# Patient Record
Sex: Male | Born: 2000 | Race: White | Hispanic: No | Marital: Single | Smoking: Never smoker
Health system: Southern US, Community
[De-identification: ages and names within clinical notes are randomized; demographics above are authoritative.]

---
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
## 2021-05-23 ENCOUNTER — Other Ambulatory Visit: Payer: Self-pay | Admitting: Pediatrics

## 2021-05-23 DIAGNOSIS — K759 Inflammatory liver disease, unspecified: Secondary | ICD-10-CM

## 2021-05-30 ENCOUNTER — Ambulatory Visit: Payer: BC Managed Care – PPO

## 2021-06-05 ENCOUNTER — Other Ambulatory Visit: Payer: Self-pay

## 2021-06-05 ENCOUNTER — Ambulatory Visit
Admission: RE | Admit: 2021-06-05 | Discharge: 2021-06-05 | Disposition: A | Discharge status: Home / Self Care | Payer: BC Managed Care – PPO | Source: Ambulatory Visit | Attending: Pediatrics | Admitting: Pediatrics

## 2021-06-05 DIAGNOSIS — K759 Inflammatory liver disease, unspecified: Secondary | ICD-10-CM

## 2023-01-04 IMAGING — US US ABDOMEN LIMITED
1 series · 14 of 25 positions shown · non-contrast
Comparison: None.

CLINICAL DATA: Elevated LFTs.

EXAM:
ULTRASOUND ABDOMEN LIMITED RIGHT UPPER QUADRANT

[Series 1: us abdomen limited · 0.20mm/px · 14 of 26 slices shown]
[im 1/26]
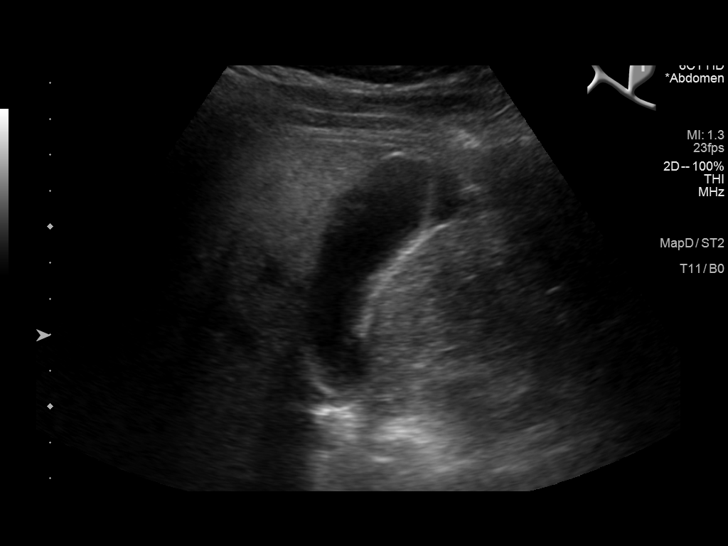
[im 3/26]
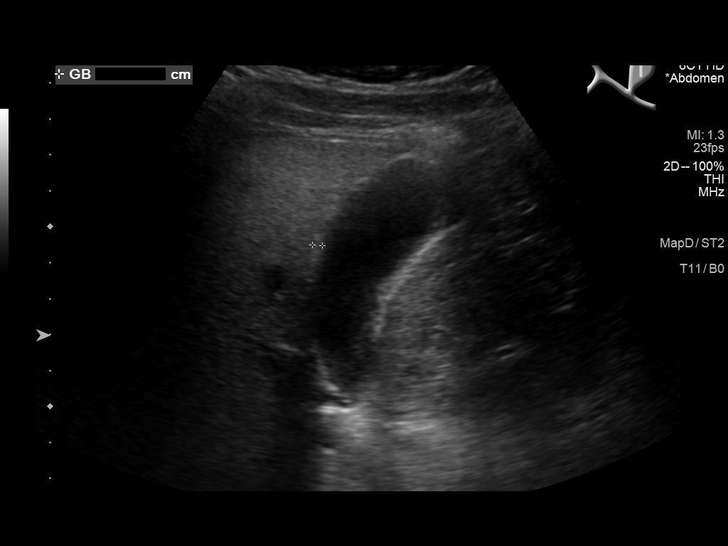
[im 5/26]
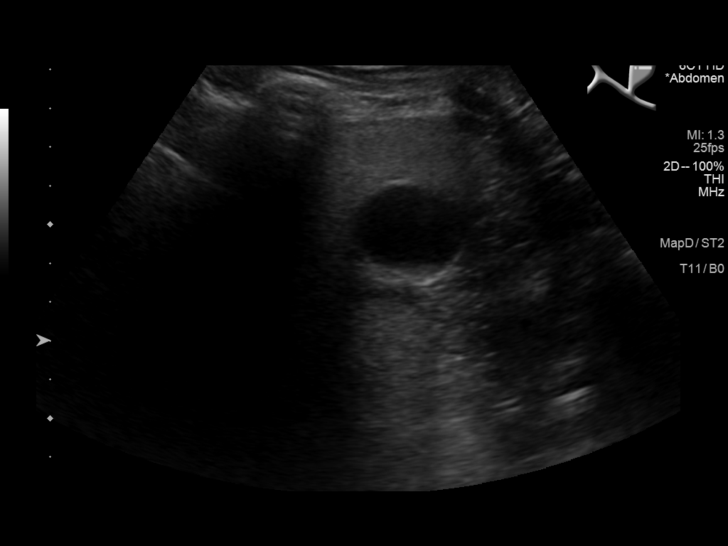
[im 7/26]
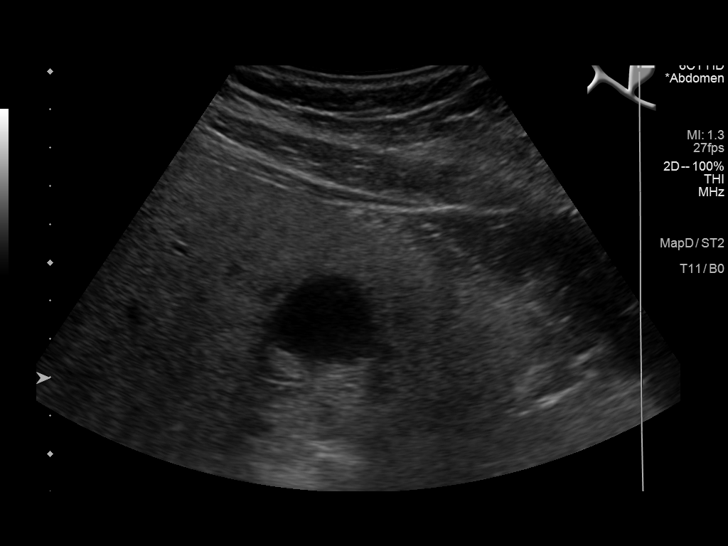
[im 9/26]
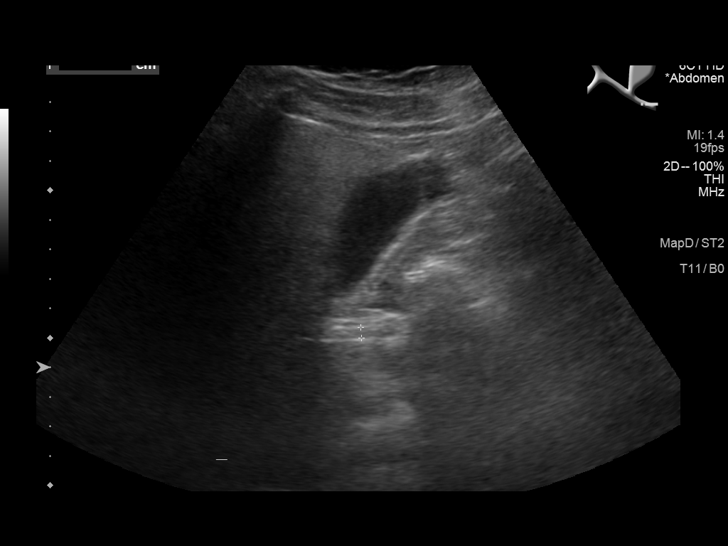
[im 10/26]
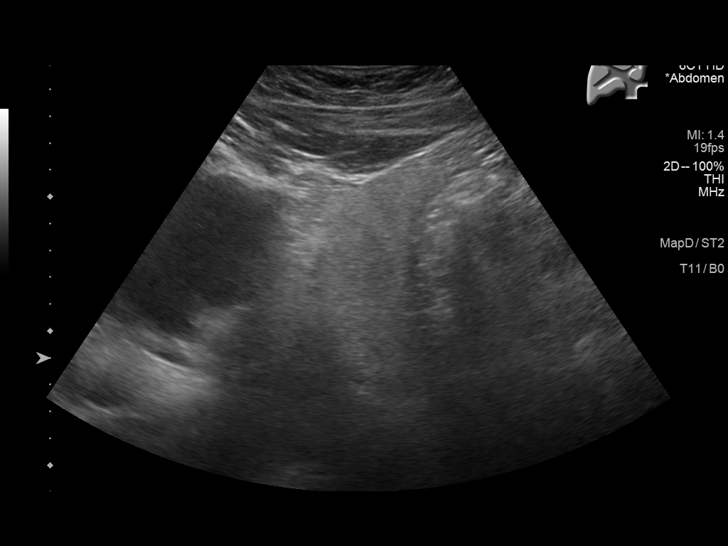
[im 12/26]
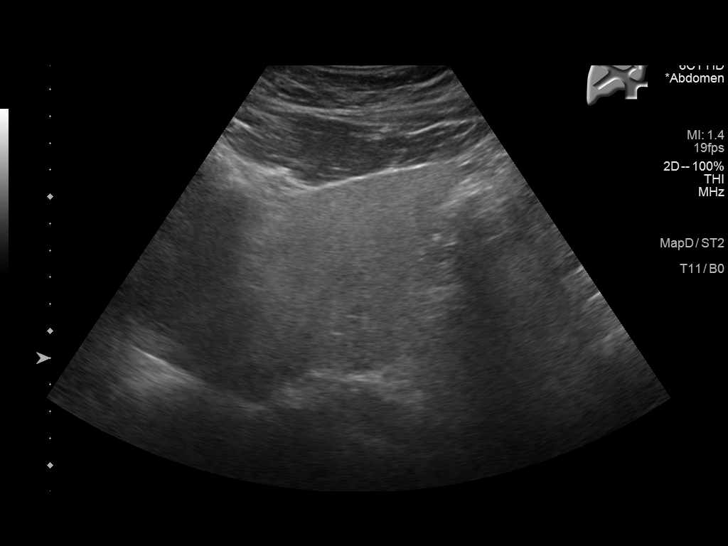
[im 14/26]
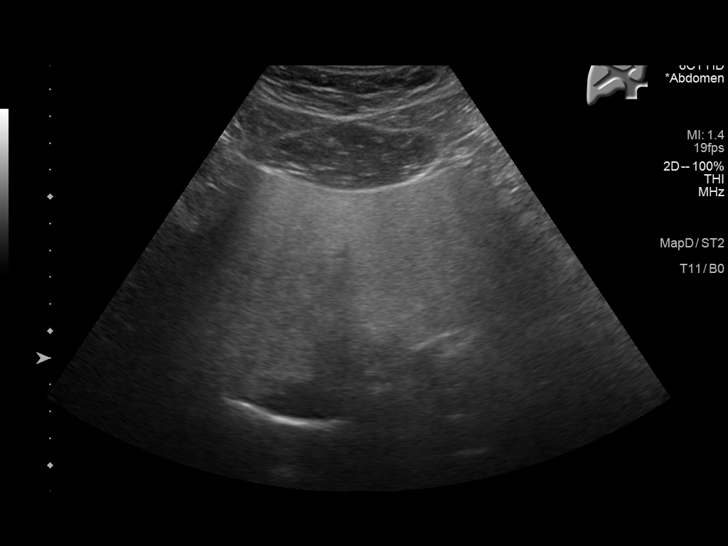
[im 16/26]
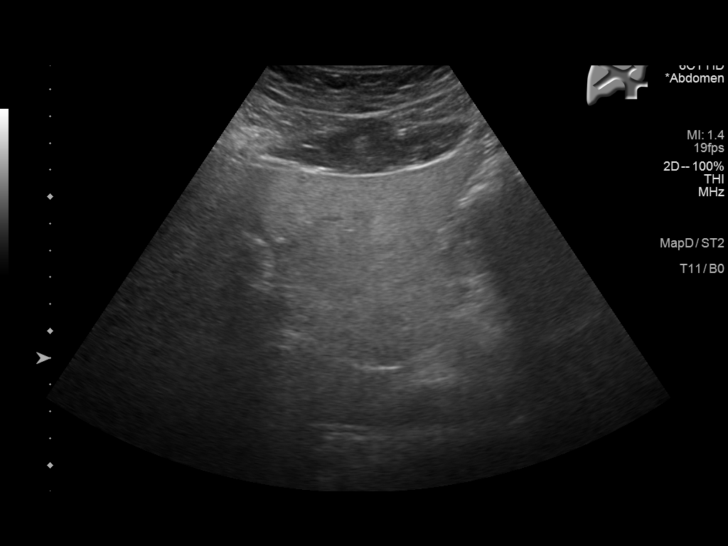
[im 17/26]
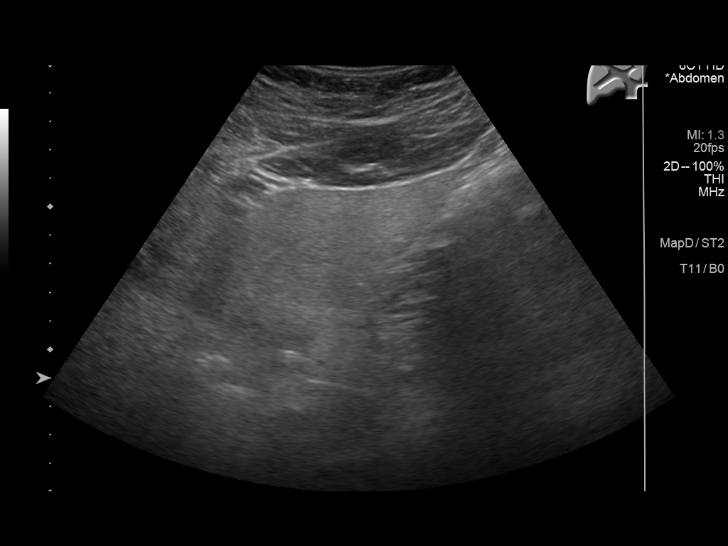
[im 19/26]
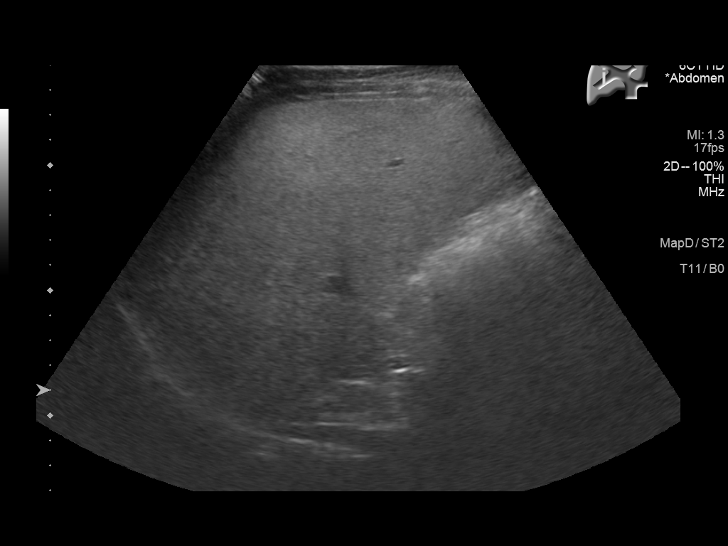
[im 21/26]
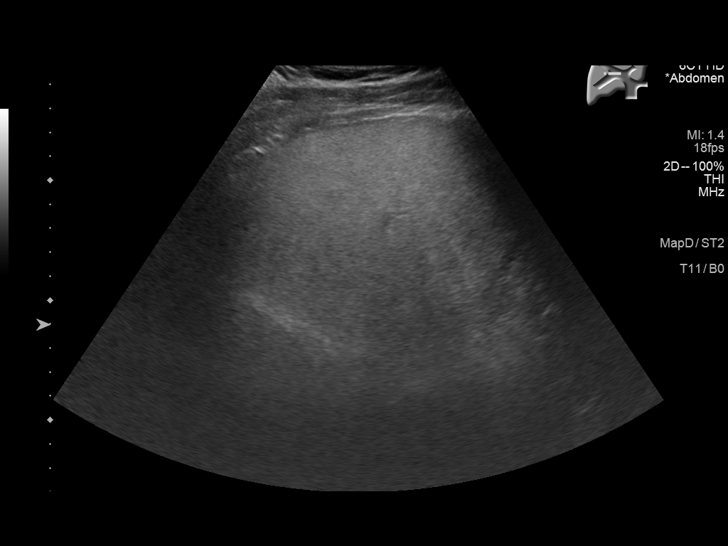
[im 23/26]
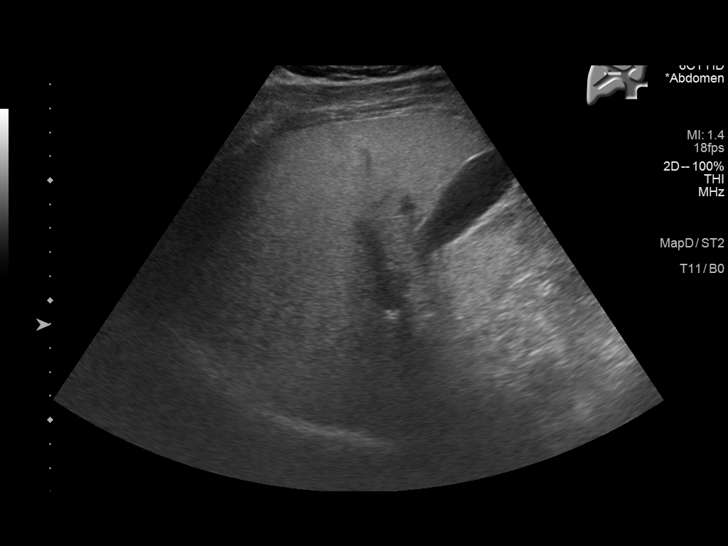
[im 26/26]
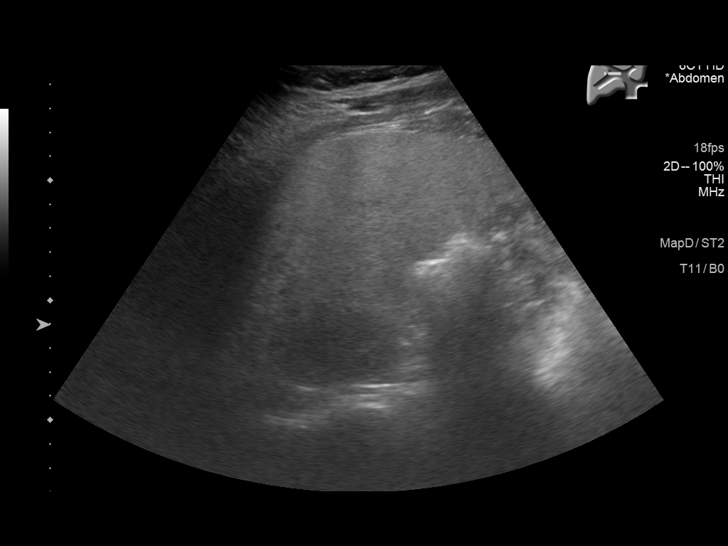

[14 of 25 positions shown; findings below may reference images not displayed]

FINDINGS: Gallbladder:

No gallstones or wall thickening visualized. No sonographic Murphy
sign noted by sonographer.

Common bile duct:

Diameter: 4 mm

Liver:

There is diffuse increased liver echogenicity most commonly seen in
the setting of fatty infiltration. Superimposed inflammation or
fibrosis is not excluded. Clinical correlation is recommended.
Portal vein is patent on color Doppler imaging with normal direction
of blood flow towards the liver.

Other: None.
IMPRESSION: Fatty liver, otherwise unremarkable right upper quadrant ultrasound.
